# Patient Record
Sex: Male | Born: 1950 | Race: White | Hispanic: No | Marital: Married | Smoking: Current every day smoker
Health system: Southern US, Community
[De-identification: ages and names within clinical notes are randomized; demographics above are authoritative.]

## PROBLEM LIST (undated history)

## (undated) DIAGNOSIS — E079 Disorder of thyroid, unspecified: Secondary | ICD-10-CM

## (undated) DIAGNOSIS — M1990 Unspecified osteoarthritis, unspecified site: Secondary | ICD-10-CM

## (undated) HISTORY — DX: Unspecified osteoarthritis, unspecified site: M19.90

## (undated) HISTORY — DX: Disorder of thyroid, unspecified: E07.9

---

## 1971-06-17 HISTORY — PX: SHOULDER SURGERY: SHX246

## 1996-06-16 HISTORY — PX: BACK SURGERY: SHX140

## 2004-06-16 HISTORY — PX: NECK SURGERY: SHX720

## 2009-03-22 DIAGNOSIS — Z720 Tobacco use: Secondary | ICD-10-CM | POA: Insufficient documentation

## 2009-03-26 ENCOUNTER — Ambulatory Visit: Payer: Self-pay | Admitting: Gastroenterology

## 2010-07-09 ENCOUNTER — Ambulatory Visit: Payer: Self-pay

## 2011-03-24 ENCOUNTER — Ambulatory Visit: Payer: Self-pay | Admitting: Family Medicine

## 2011-05-28 ENCOUNTER — Ambulatory Visit: Payer: Self-pay | Admitting: Orthopedic Surgery

## 2011-06-23 LAB — HM COLONOSCOPY: HM Colonoscopy: NORMAL

## 2012-03-24 ENCOUNTER — Ambulatory Visit: Payer: Self-pay

## 2012-05-04 ENCOUNTER — Ambulatory Visit: Payer: Self-pay | Admitting: Specialist

## 2012-06-29 ENCOUNTER — Ambulatory Visit: Payer: Self-pay | Admitting: Specialist

## 2012-08-27 ENCOUNTER — Ambulatory Visit: Payer: Self-pay | Admitting: Family Medicine

## 2014-01-16 IMAGING — CR DG [PERSON_NAME] SPINE FLEX
1 series · 2 of 2 positions shown · non-contrast
Comparison: none

REASON FOR EXAM: instability
COMMENTS:

PROCEDURE:     KDR - KDXR L-SPINE FLEX-EXTENSION ONLY  - May 04, 2012  [DATE]
RESULT:     Comparison: 03/24/2012

[Series 1: flexion · 0.17mm/px · 2 of 2 slices shown]
[im 1/2]
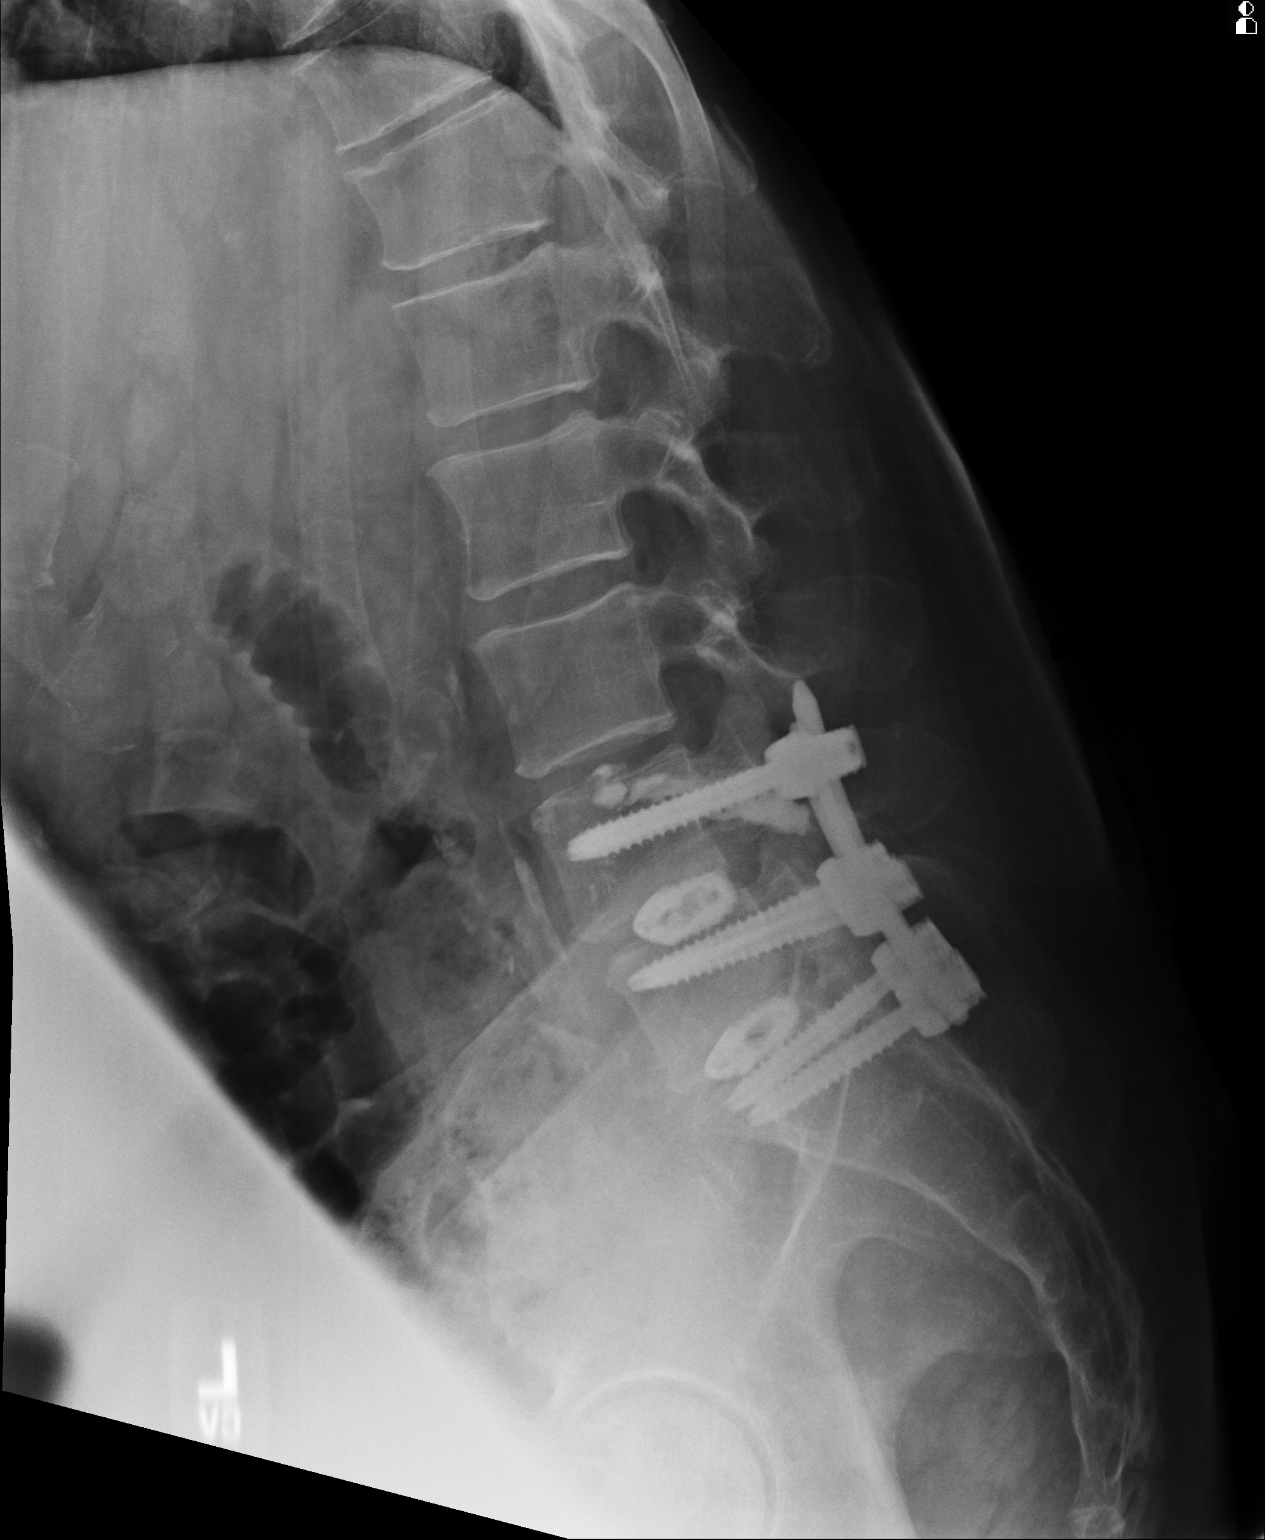
[im 2/2]
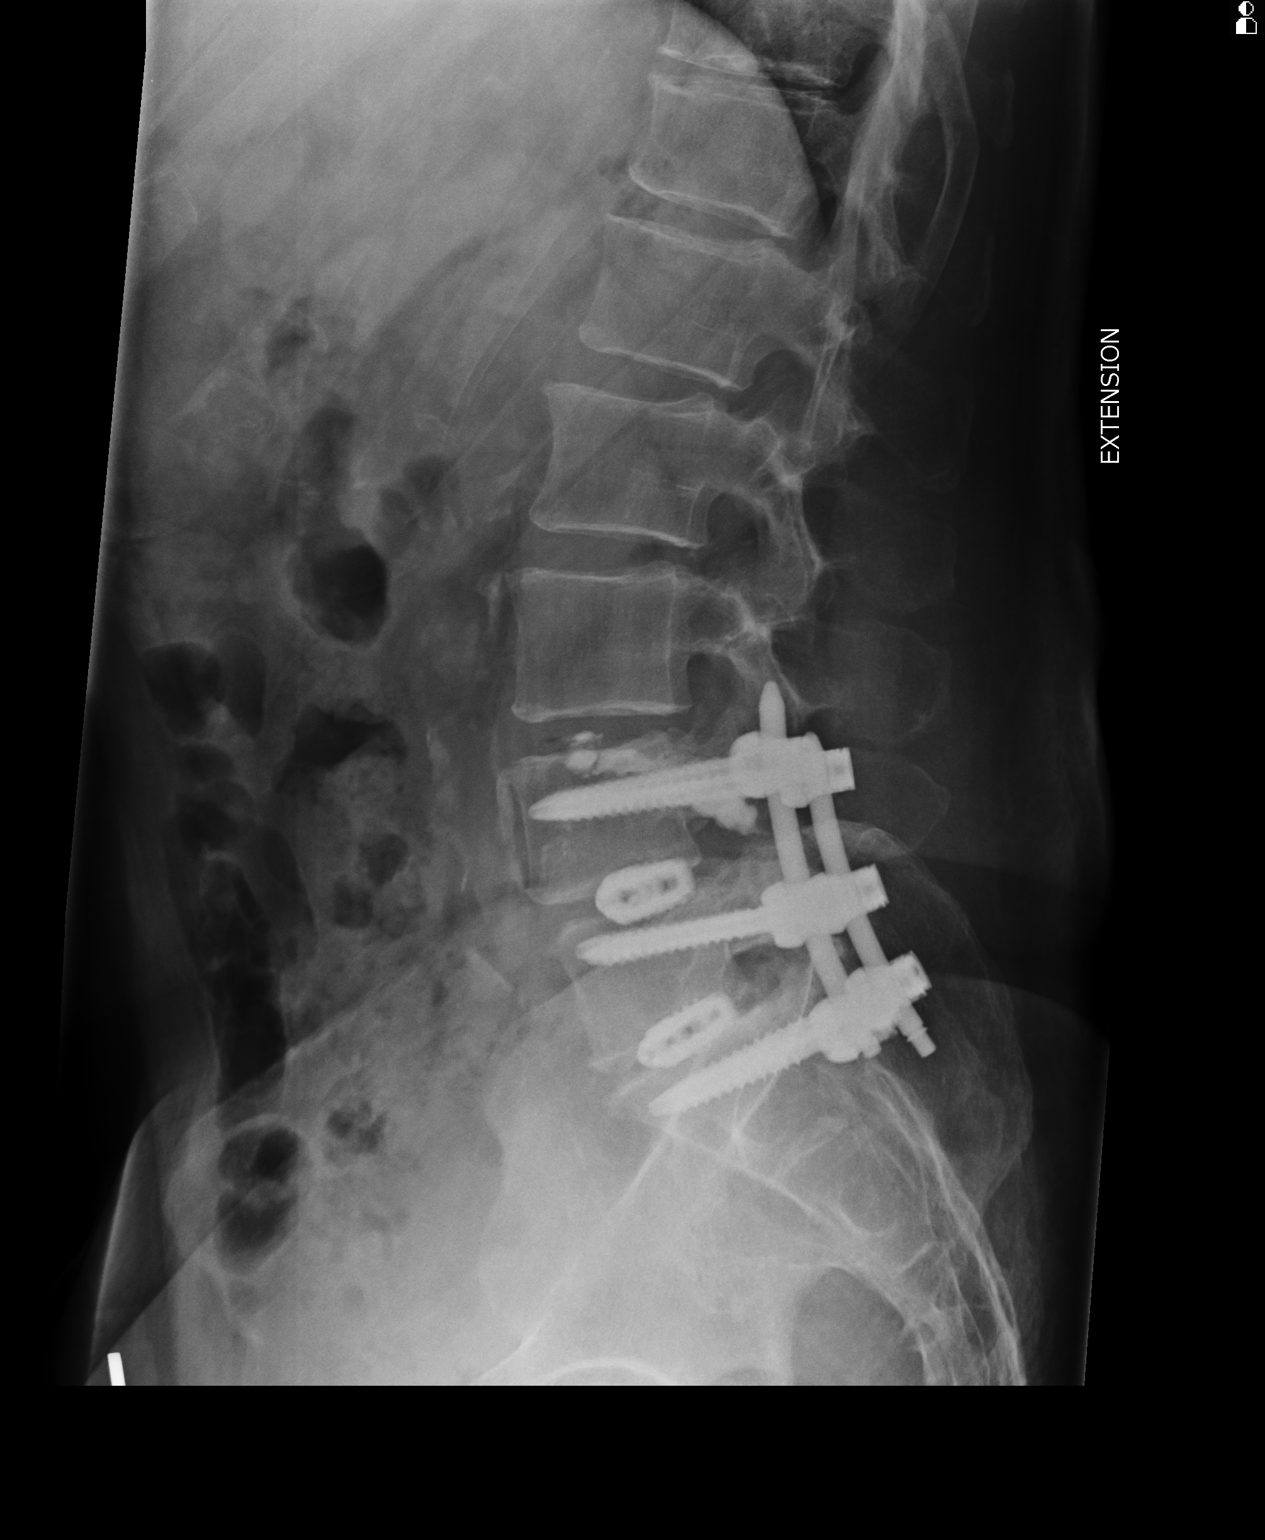

[2 of 2 positions shown; findings below may reference images not displayed]

FINDINGS: Posterior spinal fusion hardware is seen in the lower lumbar spine.
Alignment is maintained with flexion and extension, given differences in
obliquity.
IMPRESSION: Alignment is maintained with flexion and extension, given differences in
obliquity.

[REDACTED]

## 2014-08-08 ENCOUNTER — Ambulatory Visit: Payer: Self-pay | Admitting: Family Medicine

## 2014-08-10 ENCOUNTER — Inpatient Hospital Stay: Payer: Self-pay | Admitting: Internal Medicine

## 2014-08-21 ENCOUNTER — Ambulatory Visit: Payer: Self-pay | Admitting: Family Medicine

## 2014-09-23 LAB — BASIC METABOLIC PANEL
BUN: 15 mg/dL (ref 4–21)
Creatinine: 1 mg/dL (ref 0.6–1.3)
Glucose: 92 mg/dL
POTASSIUM: 4.9 mmol/L (ref 3.4–5.3)
SODIUM: 137 mmol/L (ref 137–147)

## 2014-09-23 LAB — CBC AND DIFFERENTIAL
HEMATOCRIT: 41 % (ref 41–53)
HEMOGLOBIN: 13.8 g/dL (ref 13.5–17.5)
PLATELETS: 367 10*3/uL (ref 150–399)
WBC: 8.3 10^3/mL

## 2014-09-23 LAB — HEPATIC FUNCTION PANEL
ALT: 34 U/L (ref 10–40)
AST: 22 U/L (ref 14–40)

## 2014-10-15 NOTE — H&P (Signed)
PATIENT NAME:  Billy Burton, Billy Burton MR#:  [id number] DATE OF BIRTH:  February 04, 1951  DATE OF ADMISSION:  08/10/2014  PRIMARY CARE PHYSICIAN:  Dr. Lorie PhenixNancy Maloney  REFERRING  PHYSICIAN:  Dr. Glennie IsleSheryl Gottlieb  CHIEF COMPLAINT: Shortness of breath and the cough for the past seven days.   HISTORY OF PRESENT ILLNESS: A 64 year old Caucasian male with no past medical history presented the ED with the above chief complaint. The patient is alert, awake, oriented, in no acute distress. The patient said he started to have a cough and shortness of breath seven days ago. In addition, the patient has a fever and chills which is on and off for the past seven days. The patient also complains of generalized weakness. The patient even could not stand. The patient went to see his primary care physician who gave the patient antibiotics, cefdinir.  The patient has been taking antibiotics for the past two days without improvement. The patient's symptoms have been worsening.  He denies any wheezing or hemoptysis. No orthopnea or nocturnal dyspnea. No leg edema. The patient's chest x-ray showed multifocal pneumonia and since the patient has elevated D-dimer at about 800, the patient got CT angiogram of the chest which did not show PE, but a bilateral multifocal pneumonia. The patient is treated with antibiotics in the ED.   SOCIAL HISTORY: Smokes 1 pack a day for many years. Quit smoking years ago, but started smoking six years ago. He denies any alcohol drinking or illicit drugs.   PAST SURGICAL HISTORY: Back and neck surgery.   FAMILY HISTORY: Mother had a stroke and brother has a brain tumor.   ALLERGIES: None.  HOME MEDICATIONS:  1.  Prednisone 20 mg 3 tablets once a day for 2 days and then taper.  2.  Diclofenac 75 mg p.o. once a day p.r.n.  3.  Cefdinir 300 mg p.o. every 12 hours.  4.  Acetaminophen hydrocodone 325/5 mg p.o. tablets every six hours p.r.n.   REVIEW OF SYSTEMS:    CONSTITUTIONAL: The patient denies any  the patient has a fever, chills, headache, and dizziness and generalized weakness.  EYES: No double vision or blurred vision..  ENT: No postnasal drip, slurred speech or dysphagia.  CARDIOVASCULAR:  Chest pain with coughing. No palpitations. No orthopnea or nocturnal dyspnea. No leg edema.  PULMONARY: Positive for cough, shortness of breath, but no sputum and no wheezing. No hemoptysis.  GASTROINTESTINAL: No abdominal pain, nausea, vomiting, diarrhea. No melena or bloody stool.  GENITOURINARY: No dysuria, hematuria, or incontinence.  SKIN: No rash or jaundice.  NEUROLOGY: No syncope, loss of consciousness, or seizure.  ENDOCRINE: No polyuria, polydipsia, heat or cold intolerance.  HEMATOLOGY: No easy bruising or bleeding.   PHYSICAL EXAMINATION:  VITAL SIGNS: Temperature 98.8, blood pressure 136/80, pulse 75, oxygen saturation 98% on oxygen. GENERAL:  The patient is alert, awake, oriented, in no acute distress.  HEENT: Pupils round, equal, and reactive to light and accommodation. Moist oral mucosa.  clear oropharynx.  NECK: Supple. No JVD or carotid bruit. No lymphadenopathy. No thyromegaly.  CARDIOVASCULAR: S1 and S2. Regular rate and rhythm. No murmurs or gallops.  PULMONARY: Bilateral air entry, very weak breath sounds bilaterally with some mild crackles in the whole bilateral lungs.  No use of accessory muscles to breathe.  ABDOMEN: Soft. No distention or tenderness. No organomegaly. Bowel sounds present.Marland Kitchen.  EXTREMITIES: No edema, clubbing or cyanosis. No calf tenderness but has pedal pulses present in the neck.  SKIN: No rash or jaundice.  NEUROLOGIC: Alert and oriented x3. No focal deficit. Power 5/5. Sensory intact.   LABORATORY DATA: Chest x-ray showed multifocal bilateral patchy infiltrate, pneumonia.  CT angiogram of chest did not show any PE, but had multifocal patchy airspace disease, likely representing multifocal pneumonia or inflammatory process. WBC 6.0, hemoglobin 13.6,  platelets 169, troponin 0.04, glucose 148, BUN 16, creatinine 1.09, sodium 135, potassium 4.0, chloride 100, bicarbonate 29, BNP 229,000 D-dimer 876.   IMPRESSION:  1.  Bilateral multifocal pneumonia.  2.  Tobacco abuse.  3.  Mild hyponatremia.   PLAN OF TREATMENT:  1. The patient will be admitted to medical floor and we will start Zithromax and Rocephin and follow up her blood culture, sputum culture and CBC and also legionella antigen.  2. For tobacco abuse, smoking cessation was counseled for 4 to 5 minutes. We will give a nicotine patch.  3. For hyponatremia I will give normal saline IV, follow up BMP.  4. I discussed the patient's condition and plan of treatment with the patient and the patient's wife, the patient wants DNR.   TIME SPENT: About 53 minutes    ____________________________ Shaune Pollack, MD qc:at D: 08/10/2014 20:06:00 ET T: 08/10/2014 20:52:28 ET JOB#: [id number]  cc: Shaune Pollack, MD, <Dictator> Shaune Pollack MD ELECTRONICALLY SIGNED 08/12/2014 21:35

## 2014-10-15 NOTE — Discharge Summary (Signed)
PATIENT NAME:  Billy Burton, Billy Burton MR#:  [id number] DATE OF BIRTH:  [birth year]  DATE OF ADMISSION:  08/10/2014 DATE OF DISCHARGE:  08/12/2014  DISCHARGE DIAGNOSES: Bilateral pneumonia, abnormal TSH, tobacco abuse.   CONSULTATIONS: None.   PROCEDURES: None.   BRIEF HISTORY AND PHYSICAL AND HOSPITAL COURSE BASED ON THE PROBLEM: The patient is a 64 year old Caucasian male with no past medical history came into the ED with chief complaint of shortness of breath and cough. Please review history and physical for details. CT angiogram of the chest did not show any pulmonary embolism, but has some bilateral multifocal pneumonia. The patient was started on IV antibiotics and hospitalist team is called to admit the patient.  1.  Multifocal pneumonia. Blood cultures and sputum cultures were ordered. The patient was given Rocephin and Zithromax. Also, urine for legionella antigen was ordered. With antibiotics, Rocephin and Zithromax, the patient's clinical situation significantly improved. The patient started feeling better. His antibiotics were switched to p.o. The patient was counseled to quit smoking. The patient's oxygen was weaned off to room air. He does not need any home oxygen.  2.  Abnormal TSH. Further testing needs to be done by primary care physician in 8-12 weeks by repeating thyroid function test. 3.  Mild hyponatremia. Sodium was resolved with IV fluids.   CONDITION AT THE TIME OF DISCHARGE: Stable.   PHYSICAL EXAMINATION:  VITAL SIGNS: Temperature 97.6, pulse 61, respirations 18, blood pressure 145/76, pulse oximetry 94% on room air.  GENERAL APPEARANCE: Not in acute distress. Moderately built and nourished.  HEENT: Normocephalic, atraumatic. Pupils are equally reacting to light and accommodation. No scleral icterus. No conjunctival injection. No sinus tenderness. No postnasal drip. Moist mucous membranes.  NECK: Supple. No JVD. No thyromegaly. Range of motion is intact.  LUNGS: Positive  crackles. Moderate air entry. No wheezing. No rhonchi.  CARDIAC: S1, S2 normal. Regular rate and rhythm. No murmurs.  GASTROINTESTINAL: Soft. Bowel sounds are positive in all 4 quadrants. Nontender, nondistended. No masses felt.  NEUROLOGIC: Awake, alert, oriented x 3. Cranial nerves II-XII are grossly intact. Motor and sensory are intact. Reflexes are 2+.  EXTREMITIES: No cyanosis. No clubbing.  SKIN: Warm to touch. Normal turgor. No rashes. No lesions.   LABORATORY DATA AND IMAGING STUDIES: TSH 0.132. WBC 4.9, hemoglobin 11.9, hematocrit 35.6, platelets are normal. BMP: Glucose 146, calcium 7.9, the rest of the BMP is normal. Hemoglobin A1c at 5.8. HDL 36, total cholesterol 914, LDL is 69. EKG: Normal sinus rhythm at 72. Chest x-ray, PA and lateral views, performed on February 25, multifocal bilateral patchy infiltrate pneumonia without change in aeration. CT angiogram of the chest, the study was also done on February 25, no evidence of significant pulmonary embolus. Multifocal patchy airspace disease distributed throughout both lungs, likely representing multifocal pneumonia or inflammatory process.   CODE STATUS: No code. Do not resuscitate.    DISCHARGE MEDICATIONS: Percocet 1 tablet p.o. every 6 hours as needed for pain; diclofenac 75 mg p.o. once daily as needed for pain; prednisone tapering dose starting with 50 mg once a day, then 40 mg once a day, 30 mg for 1 day, then 20 mg for 1 day, and then 1 tablet of 10 mg for 1 day and then discontinue; nicotine 14 mg patch over-the-counter transdermally to place once daily; Proventil 2 puffs inhalation every 4 hours as needed for shortness of breath; Augmentin 500/125 one tablet p.o. every 8 hours; Florastor 50 mg 1 capsule p.o. 2 times a day for  5 days.   DIET: Regular with regular consistency.   FOLLOWUP: With primary care physician in 1 week. Sputum cultures are pending at the time of discharge; these need to be followed up by PCP. PCP to consider  repeat thyroid test in 8-12 weeks, as his TSH was abnormal, which could be an acute phase reactant.   Plan of care was discussed with the patient. He is in agreement with the plan.   TOTAL TIME SPENT ON THE DISCHARGE: 45 minutes.    ____________________________ Ramonita LabAruna Francina Beery, MD ag:bm D: 08/17/2014 15:18:03 ET T: 08/17/2014 23:28:05 ET JOB#: [id number]  cc: Ramonita LabAruna Hagen Tidd, MD, <Dictator> Primary Care Physician Ramonita LabARUNA Will Schier MD ELECTRONICALLY SIGNED 08/29/2014 20:41

## 2015-03-20 ENCOUNTER — Ambulatory Visit (INDEPENDENT_AMBULATORY_CARE_PROVIDER_SITE_OTHER): Payer: No Typology Code available for payment source | Admitting: Family Medicine

## 2015-03-20 ENCOUNTER — Encounter: Payer: Self-pay | Admitting: Family Medicine

## 2015-03-20 ENCOUNTER — Telehealth: Payer: Self-pay | Admitting: Family Medicine

## 2015-03-20 VITALS — BP 124/82 | HR 80 | Temp 98.3°F | Resp 16 | Wt 190.0 lb

## 2015-03-20 DIAGNOSIS — R634 Abnormal weight loss: Secondary | ICD-10-CM | POA: Insufficient documentation

## 2015-03-20 DIAGNOSIS — R0789 Other chest pain: Secondary | ICD-10-CM | POA: Insufficient documentation

## 2015-03-20 DIAGNOSIS — R519 Headache, unspecified: Secondary | ICD-10-CM | POA: Insufficient documentation

## 2015-03-20 DIAGNOSIS — M545 Low back pain, unspecified: Secondary | ICD-10-CM | POA: Insufficient documentation

## 2015-03-20 DIAGNOSIS — M1990 Unspecified osteoarthritis, unspecified site: Secondary | ICD-10-CM | POA: Insufficient documentation

## 2015-03-20 DIAGNOSIS — M5440 Lumbago with sciatica, unspecified side: Secondary | ICD-10-CM | POA: Diagnosis not present

## 2015-03-20 DIAGNOSIS — H109 Unspecified conjunctivitis: Secondary | ICD-10-CM | POA: Diagnosis not present

## 2015-03-20 DIAGNOSIS — R51 Headache: Secondary | ICD-10-CM

## 2015-03-20 DIAGNOSIS — E0590 Thyrotoxicosis, unspecified without thyrotoxic crisis or storm: Secondary | ICD-10-CM | POA: Insufficient documentation

## 2015-03-20 DIAGNOSIS — R1012 Left upper quadrant pain: Secondary | ICD-10-CM | POA: Insufficient documentation

## 2015-03-20 MED ORDER — SULFACETAMIDE SODIUM 10 % OP SOLN: 2.0000 [drp] | OPHTHALMIC | Status: DC

## 2015-03-20 NOTE — Telephone Encounter (Signed)
Pt's wife called saying the pharmacy said they needed authorization for the eye drops you prescribed this am during his visit.  They uses [facility].  Call back for patient is [phone]

## 2015-03-20 NOTE — Progress Notes (Signed)
Subjective:    Patient ID: Billy Corners., male    DOB: 08/09/1950, 64 y.o.   MRN: [id number]  Eye Problem  The left eye is affected. This is a new problem. The current episode started in the past 7 days. The problem has been unchanged. There was no injury mechanism. The pain is at a severity of 0/10. The patient is experiencing no pain. There is no known exposure to pink eye. He does not wear contacts. Associated symptoms include blurred vision, an eye discharge, eye redness, itching and weakness. Pertinent negatives include no double vision, fever, foreign body sensation, nausea, photophobia, recent URI, tingling or vomiting. He has tried eye drops for the symptoms. The treatment provided no relief.  Back Pain This is a chronic problem. The current episode started more than 1 year ago (x 15 years). The problem occurs constantly. The problem has been gradually worsening since onset. The pain is present in the lumbar spine. The quality of the pain is described as aching. The pain radiates to the left knee and right knee. The pain is at a severity of 6/10. The pain is moderate. The pain is worse during the day. The symptoms are aggravated by sitting (walking). Associated symptoms include headaches, leg pain and weakness. Pertinent negatives include no abdominal pain, bladder incontinence, bowel incontinence, chest pain, dysuria, fever, numbness, pelvic pain or tingling. He has tried analgesics for the symptoms. The treatment provided significant relief.      Review of Systems  Constitutional: Positive for appetite change. Negative for fever, chills, diaphoresis, activity change, fatigue and unexpected weight change.       Malaise is present  Eyes: Positive for blurred vision, discharge and redness. Negative for double vision and photophobia.  Cardiovascular: Negative for chest pain.  Gastrointestinal: Negative for nausea, vomiting, abdominal pain and bowel incontinence.  Genitourinary:  Negative for bladder incontinence, dysuria and pelvic pain.  Musculoskeletal: Positive for back pain.  Skin: Positive for itching.  Neurological: Positive for weakness and headaches. Negative for tingling and numbness.   BP 124/82 mmHg  Pulse 80  Temp(Src) 98.3 F (36.8 C) (Oral)  Resp 16  Wt 190 lb (86.183 kg)   Patient Active Problem List   Diagnosis Date Noted  . Abdominal pain, left upper quadrant 03/20/2015  . Arthritis 03/20/2015  . Chest wall pain 03/20/2015  . Cephalalgia 03/20/2015  . Hyperthyroidism 03/20/2015  . LBP (low back pain) 03/20/2015  . Abnormal loss of weight 03/20/2015  . Hypercholesterolemia without hypertriglyceridemia 04/30/2009  . Alcohol drinker (HCC) 03/22/2009  . Acid reflux 03/22/2009  . Current tobacco use 03/22/2009   Past Medical History  Diagnosis Date  . Arthritis   . Thyroid disease    No current outpatient prescriptions on file prior to visit.   No current facility-administered medications on file prior to visit.   No Known Allergies Past Surgical History  Procedure Laterality Date  . Neck surgery  2006    fusion  . Shoulder surgery  1973    reconstruction  . Back surgery  1998    ruptured disc   Social History   Social History  . Marital Status: Married    Spouse Name: N/A  . Number of Children: N/A  . Years of Education: N/A   Occupational History  . Not on file.   Social History Main Topics  . Smoking status: Current Every Day Smoker -- 1.00 packs/day  . Smokeless tobacco: Never Used  . Alcohol Use: No  .  Drug Use: No  . Sexual Activity: Not on file   Other Topics Concern  . Not on file   Social History Narrative   Family History  Problem Relation Age of Onset  . Heart disease Mother   . Hypotension Mother   . Cancer Father   . Healthy Sister   . Healthy Brother   . Healthy Son   . Healthy Sister   . Healthy Sister   . Healthy Brother   . Healthy Son      .result     Objective:   Physical  Exam  Constitutional: He appears well-developed and well-nourished.  Eyes: Pupils are equal, round, and reactive to light. Left eye exhibits discharge. Left eye exhibits no hordeolum. No foreign body present in the left eye. Right conjunctiva is not injected. Right conjunctiva has no hemorrhage. Left conjunctiva is injected. Left conjunctiva has no hemorrhage.  Musculoskeletal:       Right hip: He exhibits decreased range of motion and decreased strength.       Left hip: He exhibits decreased range of motion and decreased strength.       Lumbar back: He exhibits decreased range of motion and pain.  Psychiatric: He has a normal mood and affect. His behavior is normal.      Assessment & Plan:   1. Conjunctivitis of left eye New problem. Advised pt to D/C Visine and start abx eye drops as below.  Patient instructed to call back if condition worsens or does not improve.    - sulfacetamide (BLEPH-10) 10 % ophthalmic solution; Place 2 drops into the left eye every 4 (four) hours.  Dispense: 15 mL; Refill: 0  2. Low back pain with sciatica, sciatica laterality unspecified, unspecified back pain laterality Stable. DMV form filled out today to handicap placard today.  Patient seen and examined by Leo Grosser, MD, and note scribed by Allene Dillon, CMA.  I have reviewed the document for accuracy and completeness and I agree with above. Leo Grosser, MD   Lorie Phenix, MD

## 2015-03-20 NOTE — Telephone Encounter (Signed)
Got it changed. Pharmacy will call the patient. Thanks.

## 2015-03-26 ENCOUNTER — Ambulatory Visit (INDEPENDENT_AMBULATORY_CARE_PROVIDER_SITE_OTHER): Payer: No Typology Code available for payment source | Admitting: Family Medicine

## 2015-03-26 ENCOUNTER — Encounter: Payer: Self-pay | Admitting: Family Medicine

## 2015-03-26 VITALS — BP 108/70 | HR 74 | Temp 97.8°F | Resp 16 | Wt 196.2 lb

## 2015-03-26 DIAGNOSIS — T50905A Adverse effect of unspecified drugs, medicaments and biological substances, initial encounter: Secondary | ICD-10-CM

## 2015-03-26 NOTE — Patient Instructions (Signed)
Stop antibiotic drops, use artificial tears and warm compresses. Call us with how you are doing in 24 hours.

## 2015-03-26 NOTE — Progress Notes (Signed)
Subjective:     Patient ID: Billy Burton., male   DOB: 06-28-1950, 64 y.o.   MRN: [id number]  HPI  Chief Complaint  Patient presents with  . Conjunctivitis    patient is present in office today for follow up visit from 03/20/15. Last office visit patient was diagnosed with conjunctivitis of the left eye and prescribed Bleph-10% eye drops he states that drops have made symptoms worse. Paitent reports burning and watering of eyes after using drops, patient states that he still has redness , itching and discharge from eye.   States he did not start the drops along with warm compresses until 10/7.  Reports using 2 drops every 2 hours. States the drops burn and his eye itching worsened. Started artificial tears which have helped the discomfort. Reports continued eye watering, mild redness and blurriness due to the watering. No prior reaction to Sulfa reported.   Review of Systems  Constitutional: Negative for fever and chills.       Objective:   Physical Exam  Constitutional: He appears well-developed and well-nourished. No distress.  Eyes: Pupils are equal, round, and reactive to light.  mild swelling and injection of left eye. Vision intact to # of fingers. Mild watery drainage. No f.b. Visualized.     Assessment:    1. Idiosyncratic reaction to medication after proper dose, initial encounter     Plan:    Stop antibiotic drops, continue artificial tears and warm compresses. Phone f/u in 24 hours.

## 2015-05-09 ENCOUNTER — Encounter: Payer: Self-pay | Admitting: Family Medicine

## 2015-05-09 ENCOUNTER — Telehealth: Payer: Self-pay

## 2015-05-09 ENCOUNTER — Ambulatory Visit (INDEPENDENT_AMBULATORY_CARE_PROVIDER_SITE_OTHER): Payer: Self-pay | Admitting: Family Medicine

## 2015-05-09 VITALS — BP 100/70 | HR 77 | Temp 97.8°F | Resp 16 | Wt 191.8 lb

## 2015-05-09 DIAGNOSIS — G510 Bell's palsy: Secondary | ICD-10-CM | POA: Insufficient documentation

## 2015-05-09 MED ORDER — PREDNISONE 10 MG PO TABS: ORAL_TABLET | ORAL | Status: DC

## 2015-05-09 MED ORDER — VALACYCLOVIR HCL 1 G PO TABS: 1000.0000 mg | ORAL_TABLET | Freq: Three times a day (TID) | ORAL | Status: DC

## 2015-05-09 NOTE — Telephone Encounter (Signed)
Pt called c/o lip numbness and eyelid droopiness. States, "my eye isn't working right". Pt denies H/A, confusion, and was speaking clearly. Encouraged pt to to go to ED if any stroke sx appear. Appointment made for 11:00 am. Allene DillonEmily Drozdowski, CMA

## 2015-05-09 NOTE — Progress Notes (Signed)
       Patient: Billy FuDanny L Hunke Sr. Male    DOB: 08/20/1950   64 y.o.   MRN: [id number] Visit Date: 05/09/2015  Today's Provider: Lorie PhenixNancy Josealfredo Adkins, MD   Chief Complaint  Patient presents with  . Numbness    on the right side of face   Subjective:    HPI  Billy FuDanny L Kaeser is here today concern about feeling numbness in the right side of his face. Noticed yesterday morning. Patient has difficulty making facial expressions, such as closing his eye or smiling. Patient states that when drinks it comes drooling out.No Pain around the jaw or behind his ear. No rash.  Does not feel sick overall. No other neurologic deficits.     Did know someone that had the same thing. Wanted to know if it was contagious.   No Known Allergies Previous Medications   ATORVASTATIN (LIPITOR) 80 MG TABLET       CITALOPRAM (CELEXA) 40 MG TABLET       HYDROCODONE-ACETAMINOPHEN (NORCO/VICODIN) 5-325 MG TABLET        Review of Systems  Constitutional: Negative for activity change, appetite change and fatigue.  Neurological: Positive for facial asymmetry and numbness. Negative for dizziness, tremors, syncope, speech difficulty and weakness.    Social History  Substance Use Topics  . Smoking status: Current Every Day Smoker -- 1.00 packs/day  . Smokeless tobacco: Never Used  . Alcohol Use: No   Objective:   BP 100/70 mmHg  Pulse 77  Temp(Src) 97.8 F (36.6 C) (Oral)  Resp 16  Wt 191 lb 12.8 oz (87 kg)  Physical Exam  Constitutional: He is oriented to person, place, and time. He appears well-developed and well-nourished.  Cardiovascular: Normal rate and regular rhythm.   Pulmonary/Chest: Effort normal and breath sounds normal.  Neurological: He is alert and oriented to person, place, and time. He displays normal reflexes. A cranial nerve deficit is present. Coordination normal.  Facial droop on the right.  Unable to close eye lid. Also,  Can not raise eyebrow.    Psychiatric: He has a normal mood and  affect. His behavior is normal. Judgment and thought content normal.      Assessment & Plan:     1. Bell's palsy New problem. Unable to close eye.  Will take medication as below.    Also, will use eye lubricant drops,  And tape eye at night.  Patient instructed to call back if condition worsens or does not improve.    - predniSONE (DELTASONE) 10 MG tablet; 6 po for 2 days and then 5 po for 2 days and then 4 po for 2 days and 3 po for 2 days and then 2 po for 2 days and then 1 po for 2 days.  Dispense: 42 tablet; Refill: 0 - valACYclovir (VALTREX) 1000 MG tablet; Take 1 tablet (1,000 mg total) by mouth 3 (three) times daily.  Dispense: 21 tablet; Refill: 0     Lorie PhenixNancy Alveta Quintela, MD  [facility]

## 2016-04-23 IMAGING — CR DG CHEST 2V
1 series · 1 of 1 positions shown · non-contrast
Comparison: 08/08/2014

CLINICAL DATA: Smoker, pneumonia

EXAM:
CHEST  2 VIEW

[dxr chest pa (or ap) and lateral]
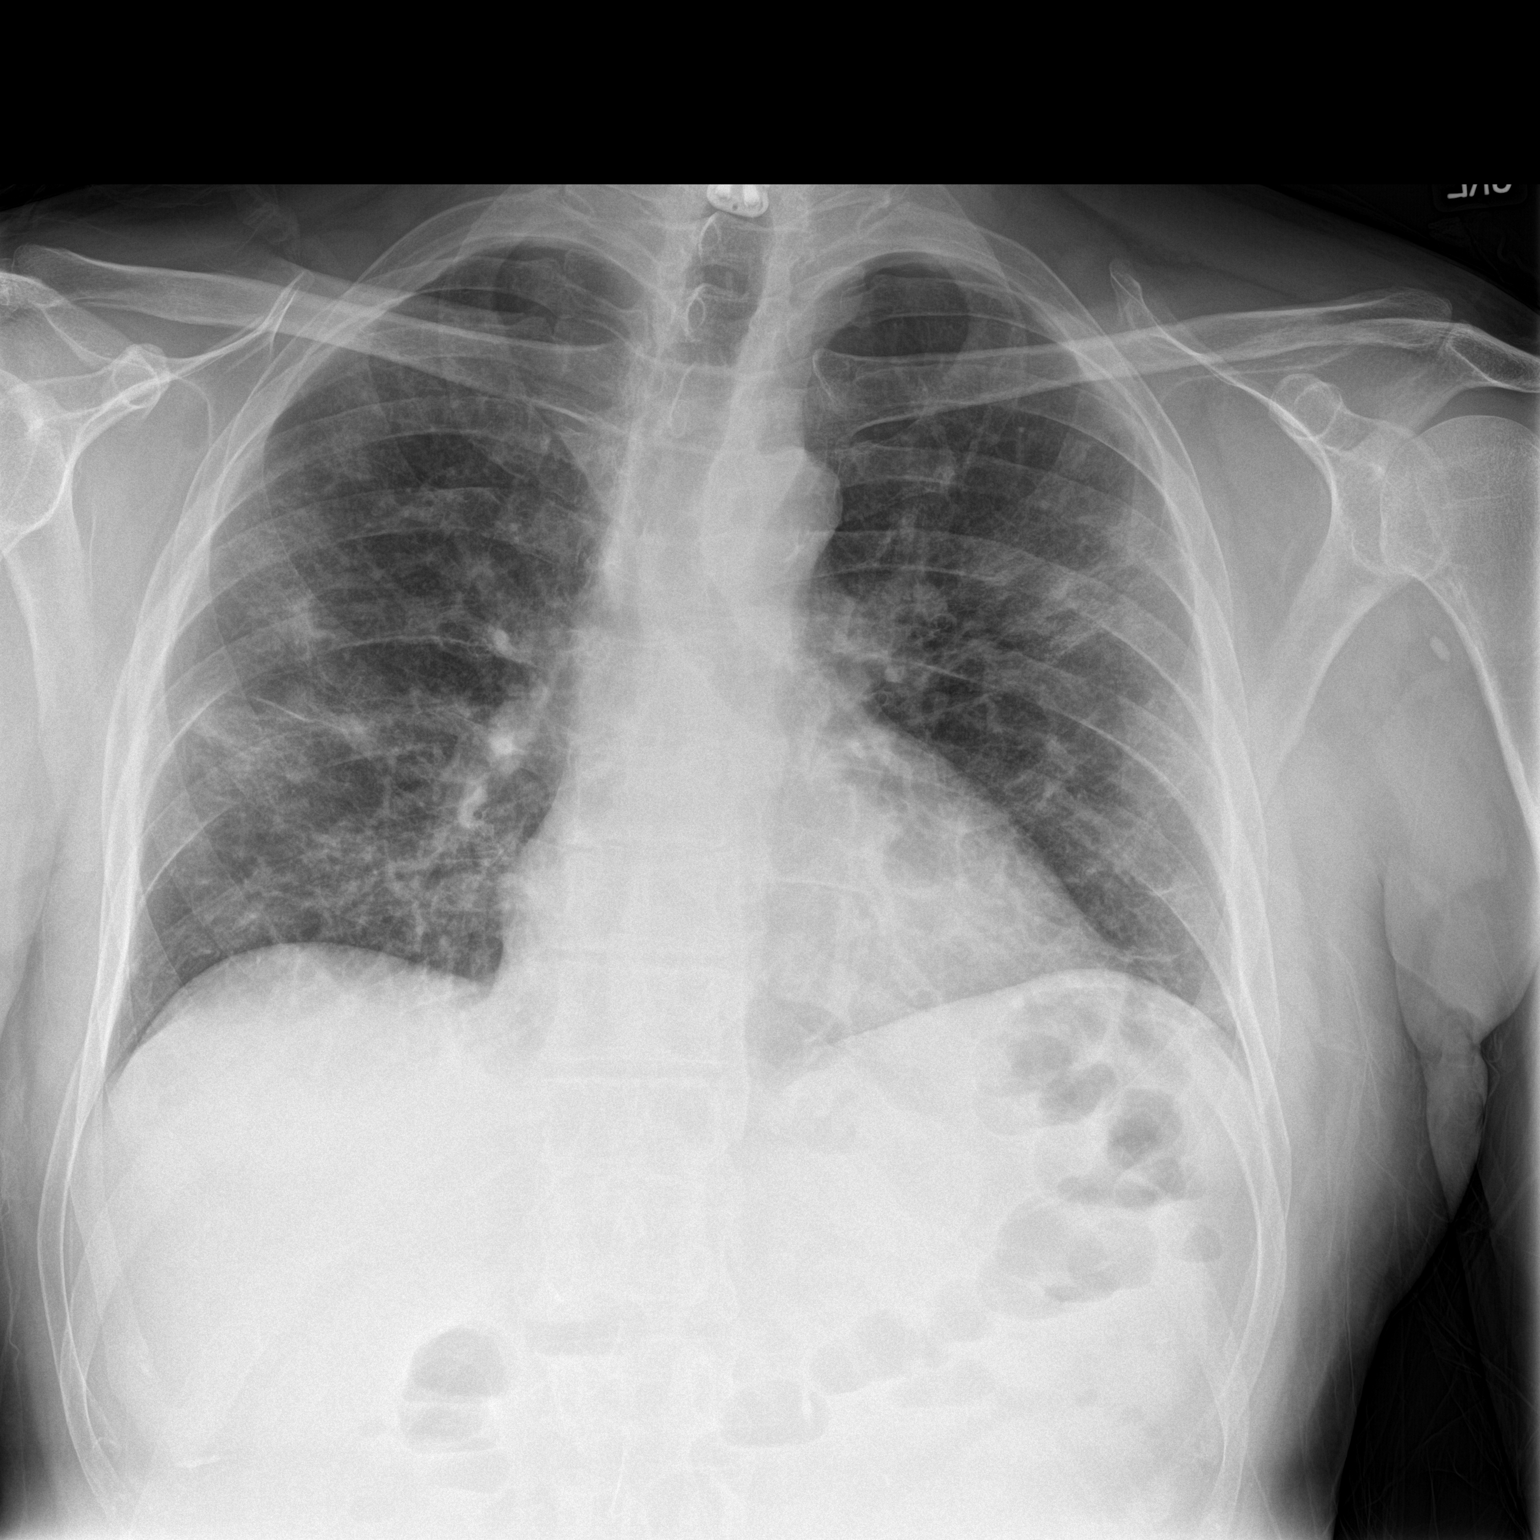

[1 of 1 positions shown; findings below may reference images not displayed]

FINDINGS: Cardiomediastinal silhouette is stable. Multifocal patchy
infiltrate/ pneumonia again noted without change from prior exam. No
pulmonary edema. Bony thorax is unremarkable.
IMPRESSION: Again noted multifocal bilateral patchy infiltrate/ pneumonia
without change in aeration.

## 2016-04-23 IMAGING — CT CT ANGIO CHEST
2 of 6 series · 18 of 36 positions shown · IV contrast (APPLIED)
Comparison: Chest 08/10/2014

CLINICAL DATA: Shortness of breath and chest pain for 1 week.
Elevated D-dimer. Sent here from primary care physician for
pneumonia.

EXAM:
CT ANGIOGRAPHY CHEST WITH CONTRAST
TECHNIQUE: Multidetector CT imaging of the chest was performed using the
standard protocol during bolus administration of intravenous
contrast. Multiplanar CT image reconstructions and MIPs were
obtained to evaluate the vascular anatomy.
CONTRAST:  100 mL Omnipaque 350

[Series 5: pe 1.0 thins · axial · 0.67mm/px · z∈[+456,+704]mm · 17 of 281 slices shown]
[im 16/281  lung]
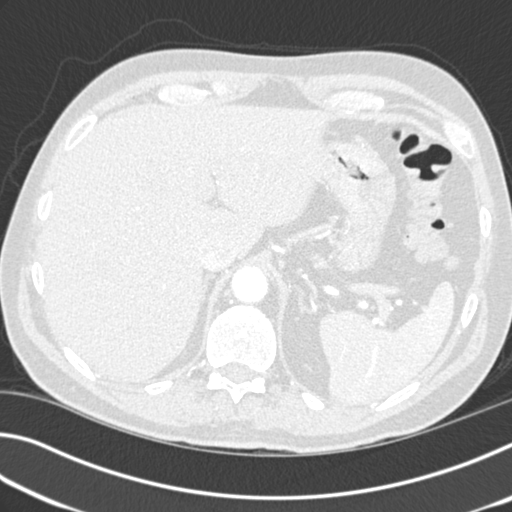
[im 32/281  mediastinal]
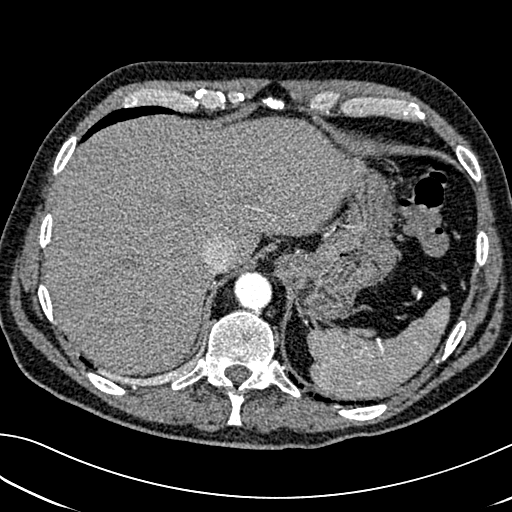
[im 47/281  lung]
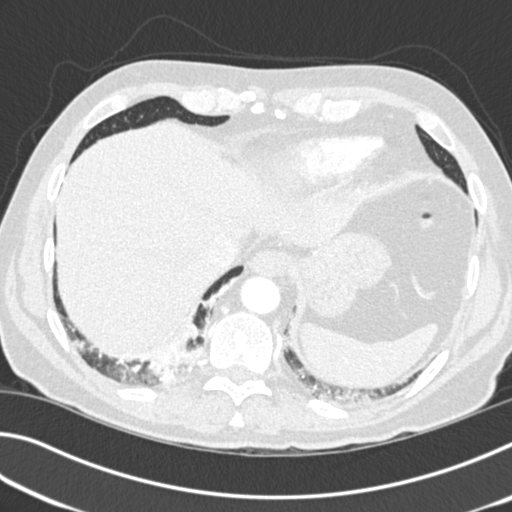
[im 63/281  mediastinal]
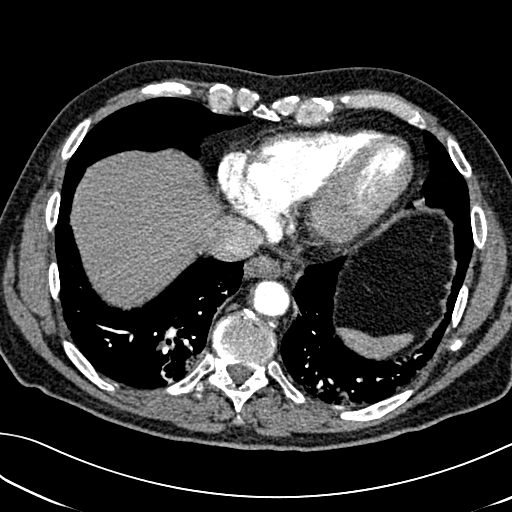
[im 78/281  lung]
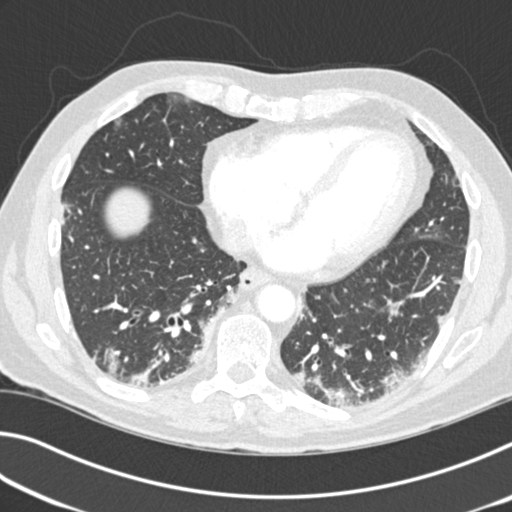
[im 94/281  mediastinal]
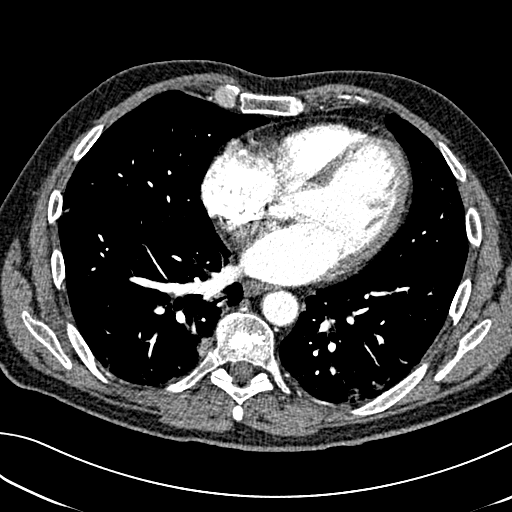
[im 109/281  lung]
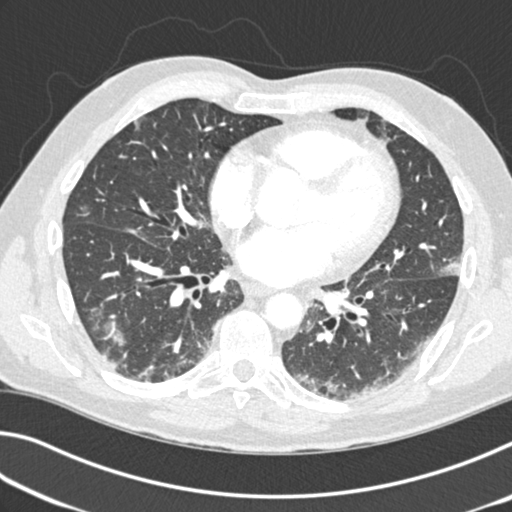
[im 125/281  mediastinal]
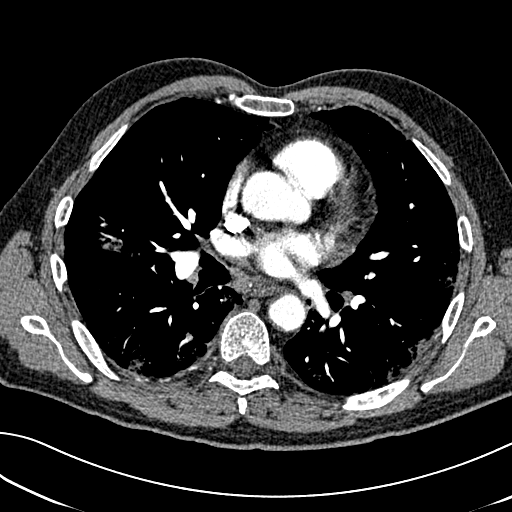
[im 141/281  lung]
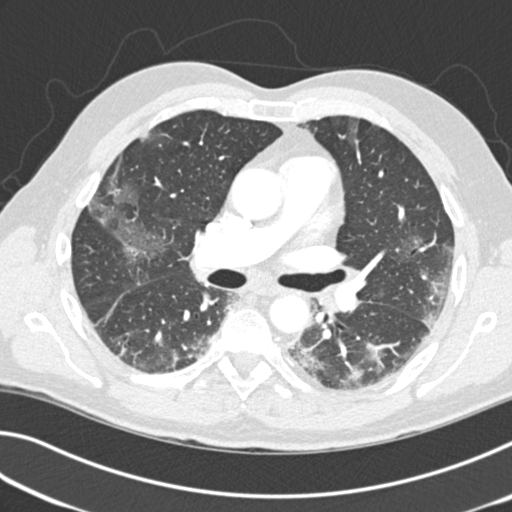
[im 156/281  mediastinal]
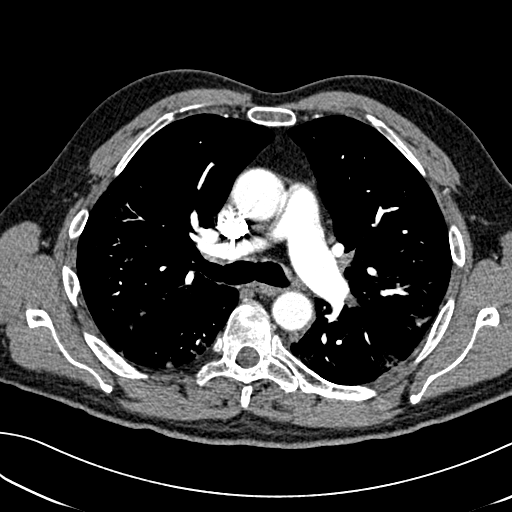
[im 172/281  lung]
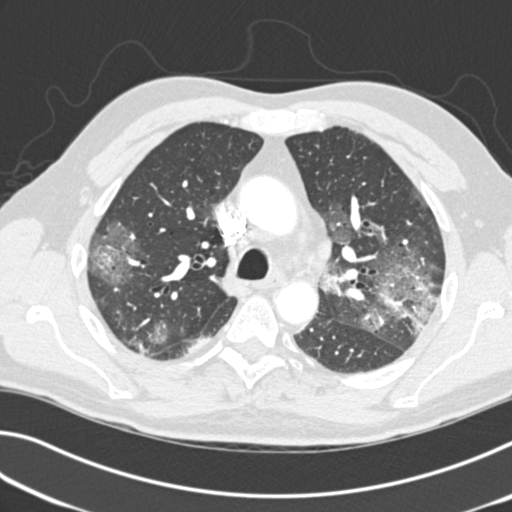
[im 187/281  mediastinal]
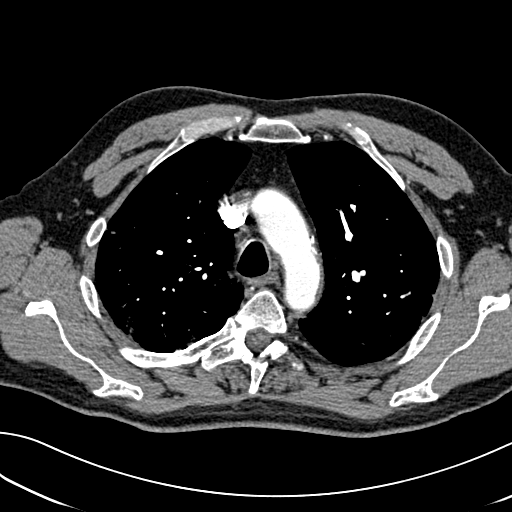
[im 203/281  lung]
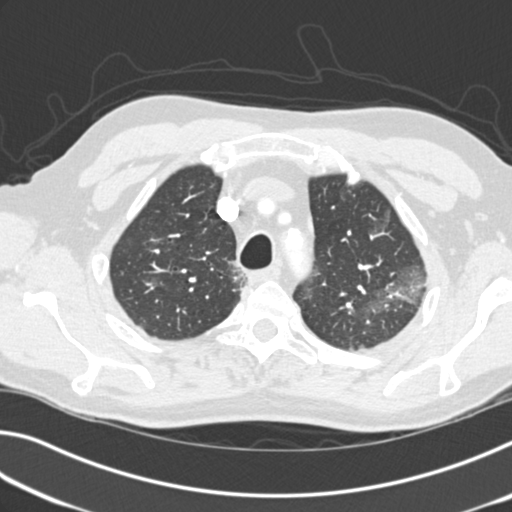
[im 218/281  mediastinal]
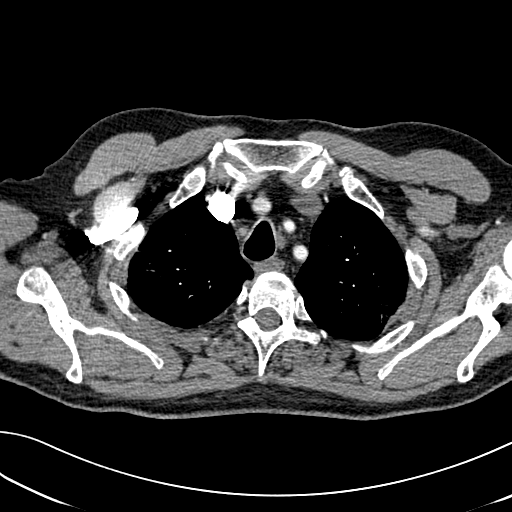
[im 234/281  lung]
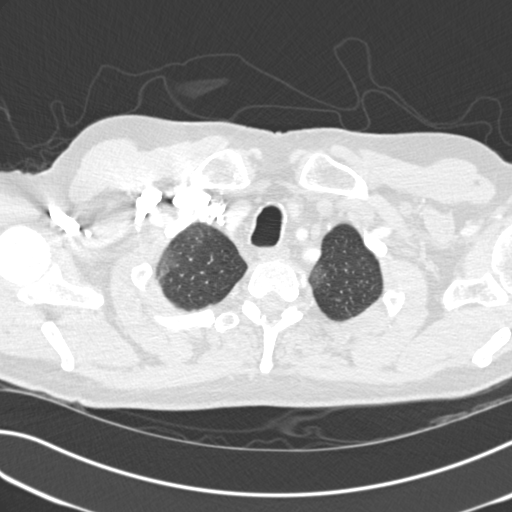
[im 249/281  mediastinal]
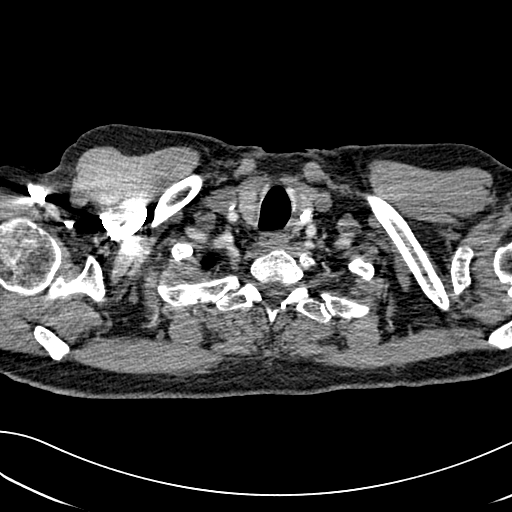
[im 265/281  lung]
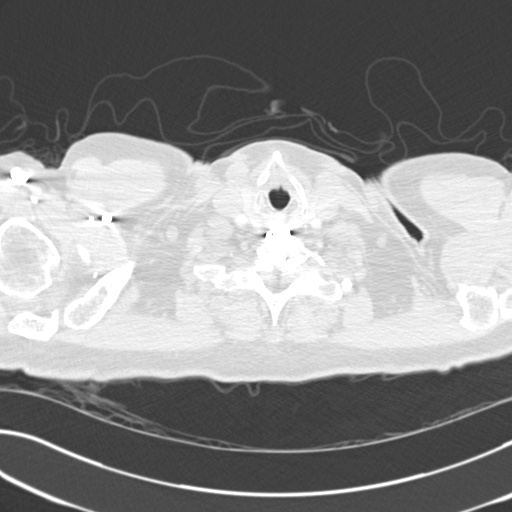

[Series 7: cor pe 2.0 mpr · coronal · 0.56mm/px · 1 of 128 slices shown]
[im 64/128  mediastinal]
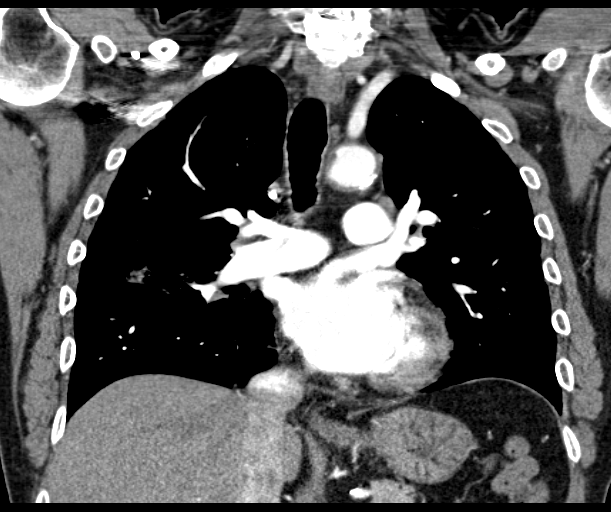

[18 of 36 positions shown; findings below may reference images not displayed]

FINDINGS: Technically adequate study with good opacification of the central
and segmental pulmonary arteries. No focal filling defects
demonstrated. No evidence of significant pulmonary embolus.

Normal heart size. Normal caliber thoracic aorta. No aortic
dissection. Great vessel origins are patent. Calcification in the
aorta and focally within the Coronary arteries. Calcification in the
left thyroid gland without enlargement. Esophagus is decompressed.
No significant lymphadenopathy in the chest.

Multifocal patchy areas of airspace disease distributed throughout
both lungs most likely represent multifocal pneumonia. Airways
appear patent. No pleural effusions. No pneumothorax.

Included portions of the upper abdominal organs are grossly
unremarkable. Mild degenerative changes in the spine. Schmorl's
nodes.

Review of the MIP images confirms the above findings.
IMPRESSION: No evidence of significant pulmonary embolus. Multifocal patchy
airspace disease distributed throughout both lungs likely
representing multifocal pneumonia or inflammatory process.

## 2020-02-03 ENCOUNTER — Telehealth: Payer: Self-pay

## 2020-02-03 NOTE — Telephone Encounter (Signed)
Yes he can establish with me.   He is previous patient of our office but not seen since 2016 with Dr. Elease Hashimoto. Will need new patient appt please.

## 2020-02-03 NOTE — Telephone Encounter (Signed)
Copied from CRM [id number]. Topic: Appointment Scheduling - Scheduling Inquiry for Clinic >> Feb 03, 2020  3:52 PM Randol Kern wrote: Reason for CRM: Pt's wife (established pt) called requesting for pt to be est. With Joycelyn Man as well. Pt's wife sees De Nurse contact: [phone] or home number [phone]

## 2020-02-06 NOTE — Telephone Encounter (Signed)
Patient scheduled.

## 2020-03-09 ENCOUNTER — Other Ambulatory Visit: Payer: Self-pay

## 2020-03-09 ENCOUNTER — Encounter: Payer: Self-pay | Admitting: Physician Assistant

## 2020-03-09 ENCOUNTER — Ambulatory Visit (INDEPENDENT_AMBULATORY_CARE_PROVIDER_SITE_OTHER): Payer: Medicare Other | Admitting: Physician Assistant

## 2020-03-09 VITALS — BP 112/82 | HR 86 | Temp 97.9°F | Ht 70.0 in | Wt 192.6 lb

## 2020-03-09 DIAGNOSIS — M25552 Pain in left hip: Secondary | ICD-10-CM

## 2020-03-09 DIAGNOSIS — G8929 Other chronic pain: Secondary | ICD-10-CM

## 2020-03-09 DIAGNOSIS — M25551 Pain in right hip: Secondary | ICD-10-CM | POA: Diagnosis not present

## 2020-03-09 DIAGNOSIS — M5442 Lumbago with sciatica, left side: Secondary | ICD-10-CM

## 2020-03-09 DIAGNOSIS — M5441 Lumbago with sciatica, right side: Secondary | ICD-10-CM | POA: Diagnosis not present

## 2020-03-09 DIAGNOSIS — Z6827 Body mass index (BMI) 27.0-27.9, adult: Secondary | ICD-10-CM

## 2020-03-09 MED ORDER — MELOXICAM 15 MG PO TABS: 15.0000 mg | ORAL_TABLET | Freq: Every day | ORAL | 0 refills | Status: AC

## 2020-03-09 MED ORDER — METHYLPREDNISOLONE 4 MG PO TBPK: ORAL_TABLET | ORAL | 0 refills | Status: AC

## 2020-03-09 NOTE — Progress Notes (Signed)
New patient visit   Patient: Billy RODDY Sr.   DOB: 28-Aug-1950   69 y.o. Male  MRN: [id number] Visit Date: 03/09/2020  Today's healthcare provider: Margaretann Loveless, PA-C   Chief Complaint  Patient presents with   New Patient (Initial Visit)   Hip Pain   Subjective    Billy Fu Sr. is a 69 y.o. male who presents today as a new patient to establish care. Patient previously was a patient of Dr. Elease Hashimoto. He is followed by the [place] [facility] for most medical care.  Patient also needs Handi-cap placard signed.   Hip Pain  The incident occurred more than 1 week ago. There was no injury mechanism. The pain is present in the left hip and right hip. The quality of the pain is described as burning. The pain is at a severity of 2/10 (walking- 10). The pain has been constant since onset. Pertinent negatives include no inability to bear weight, loss of motion, loss of sensation, muscle weakness, numbness or tingling. He reports no foreign bodies present. The symptoms are aggravated by weight bearing and movement. He has tried rest and non-weight bearing for the symptoms.    He does have h/o chronic back pain and is s/p lumbar fusion and cervical fusion. He is on chronic pain management from the [place] (hydrocodone-apap).  Past Medical History:  Diagnosis Date   Arthritis    Thyroid disease    Past Surgical History:  Procedure Laterality Date   BACK SURGERY  1998   ruptured disc   NECK SURGERY  2006   fusion   SHOULDER SURGERY  1973   reconstruction   Family Status  Relation Name Status   Mother  Deceased at age 98       stroke   Father  Deceased at age 10       MI   Sister ##Sister1 Alive   Brother ##Brother1 Alive   Son ##Son1 Alive   Sister ##Sister2 Alive   Sister ##Sister3 Alive   Brother ##Brother2 Alive   Son ##Son2 Alive   Family History  Problem Relation Age of Onset   Heart disease Mother    Hypotension Mother    Cancer Father     Healthy Sister    Healthy Brother    Healthy Son    Healthy Sister    Healthy Sister    Healthy Brother    Healthy Son    Social History   Socioeconomic History   Marital status: Married    Spouse name: Not on file   Number of children: Not on file   Years of education: Not on file   Highest education level: Not on file  Occupational History   Not on file  Tobacco Use   Smoking status: Current Every Day Smoker    Packs/day: 1.00   Smokeless tobacco: Never Used  Substance and Sexual Activity   Alcohol use: No   Drug use: No   Sexual activity: Not on file  Other Topics Concern   Not on file  Social History Narrative   Not on file   Social Determinants of Health   Financial Resource Strain:    Difficulty of Paying Living Expenses: Not on file  Food Insecurity:    Worried About Programme researcher, broadcasting/film/video in the Last Year: Not on file   [employer] of Food in the Last Year: Not on file  Transportation Needs:    Lack of Transportation (Medical): Not  on file   Lack of Transportation (Non-Medical): Not on file  Physical Activity:    Days of Exercise per Week: Not on file   Minutes of Exercise per Session: Not on file  Stress:    Feeling of Stress : Not on file  Social Connections:    Frequency of Communication with Friends and Family: Not on file   Frequency of Social Gatherings with Friends and Family: Not on file   Attends Religious Services: Not on file   Active Member of Clubs or Organizations: Not on file   Attends Banker Meetings: Not on file   Marital Status: Not on file   Outpatient Medications Prior to Visit  Medication Sig   HYDROcodone-acetaminophen (NORCO/VICODIN) 5-325 MG tablet    [DISCONTINUED] atorvastatin (LIPITOR) 80 MG tablet  (Patient not taking: Reported on 03/09/2020)   [DISCONTINUED] citalopram (CELEXA) 40 MG tablet  (Patient not taking: Reported on 03/09/2020)   [DISCONTINUED] predniSONE (DELTASONE) 10 MG  tablet 6 po for 2 days and then 5 po for 2 days and then 4 po for 2 days and 3 po for 2 days and then 2 po for 2 days and then 1 po for 2 days. (Patient not taking: Reported on 03/09/2020)   [DISCONTINUED] valACYclovir (VALTREX) 1000 MG tablet Take 1 tablet (1,000 mg total) by mouth 3 (three) times daily. (Patient not taking: Reported on 03/09/2020)   No facility-administered medications prior to visit.   No Known Allergies   There is no immunization history on file for this patient.  Health Maintenance  Topic Date Due   Hepatitis C Screening  Never done   COVID-19 Vaccine (1) Never done   TETANUS/TDAP  Never done   PNA vac Low Risk Adult (1 of 2 - PCV13) Never done   INFLUENZA VACCINE  03/09/2020 (Originally 01/15/2020)   COLONOSCOPY  06/22/2021    Patient Care Team: Reine Just as PCP - General (Family Medicine)  Review of Systems  Constitutional: Positive for fatigue.  HENT: Positive for tinnitus.   Eyes: Negative.   Respiratory: Negative.   Cardiovascular: Negative.   Gastrointestinal: Negative.   Endocrine: Negative.   Genitourinary: Negative.   Musculoskeletal: Positive for arthralgias, back pain and neck stiffness.  Skin: Negative.   Allergic/Immunologic: Negative.   Neurological: Negative.  Negative for tingling and numbness.  Hematological: Negative.   Psychiatric/Behavioral: Negative.       Objective    BP 112/82 (BP Location: Left Arm, Patient Position: Sitting, Cuff Size: Large)    Pulse 86    Temp 97.9 F (36.6 C) (Oral)    Ht 5\' 10"  (1.778 m)    Wt 192 lb 9.6 oz (87.4 kg)    BMI 27.64 kg/m  Physical Exam Vitals reviewed.  Constitutional:      General: He is not in acute distress.    Appearance: Normal appearance. He is well-developed. He is not ill-appearing or diaphoretic.  HENT:     Head: Normocephalic and atraumatic.  Eyes:     General: No scleral icterus.    Extraocular Movements: Extraocular movements intact.  Neck:      Thyroid: No thyromegaly.     Vascular: No carotid bruit or JVD.     Trachea: No tracheal deviation.  Cardiovascular:     Rate and Rhythm: Normal rate and regular rhythm.     Pulses: Normal pulses.     Heart sounds: Normal heart sounds. No murmur heard.  No friction rub. No gallop.  Pulmonary:     Effort: Pulmonary effort is normal. No respiratory distress.     Breath sounds: Normal breath sounds. No wheezing or rales.  Musculoskeletal:     Cervical back: Normal range of motion and neck supple.     Right lower leg: No edema.     Left lower leg: No edema.  Lymphadenopathy:     Cervical: No cervical adenopathy.  Skin:    Capillary Refill: Capillary refill takes less than 2 seconds.  Neurological:     General: No focal deficit present.     Mental Status: He is alert. Mental status is at baseline.      Depression Screen No flowsheet data found. No results found for any visits on 03/09/20.  Assessment & Plan      1. Bilateral hip pain DDx: OA vs neurogenic claudication. Will treat with medrol dose pak as below. Transition to meloxicam once medrol completed. Discussed getting xrays but will wait to see how he responds to the medications. May get [facility] at [facility]. Handicap placard completed and given to patient. Call if worsening.  - methylPREDNISolone (MEDROL) 4 MG TBPK tablet; 6 day taper; take as directed on package instructions  Dispense: 21 tablet; Refill: 0 - meloxicam (MOBIC) 15 MG tablet; Take 1 tablet (15 mg total) by mouth daily. Start once steroid (medrol) is completed  Dispense: 90 tablet; Refill: 0  2. Chronic low back pain with bilateral sciatica, unspecified back pain laterality See above medical treatment plan. - methylPREDNISolone (MEDROL) 4 MG TBPK tablet; 6 day taper; take as directed on package instructions  Dispense: 21 tablet; Refill: 0 - meloxicam (MOBIC) 15 MG tablet; Take 1 tablet (15 mg total) by mouth daily. Start once steroid (medrol) is completed  Dispense:  90 tablet; Refill: 0  3. BMI 27.0-27.9,adult Counseled patient on healthy lifestyle modifications including dieting and exercise.    No follow-ups on file.     Delmer Islam, PA-C, have reviewed all documentation for this visit. The documentation on 03/12/20 for the exam, diagnosis, procedures, and orders are all accurate and complete.   Reine Just  [facility] [phone] (phone) [phone] (fax)  [facility]

## 2020-03-09 NOTE — Patient Instructions (Addendum)
Hip Pain The hip is the joint between the upper legs and the lower pelvis. The bones, cartilage, tendons, and muscles of your hip joint support your body and allow you to move around. Hip pain can range from a minor ache to severe pain in one or both of your hips. The pain may be felt on the inside of the hip joint near the groin, or on the outside near the buttocks and upper thigh. You may also have swelling or stiffness in your hip area. Follow these instructions at home: Managing pain, stiffness, and swelling      If directed, put ice on the painful area. To do this: ? Put ice in a plastic bag. ? Place a towel between your skin and the bag. ? Leave the ice on for 20 minutes, 2-3 times a day.  If directed, apply heat to the affected area as often as told by your health care provider. Use the heat source that your health care provider recommends, such as a moist heat pack or a heating pad. ? Place a towel between your skin and the heat source. ? Leave the heat on for 20-30 minutes. ? Remove the heat if your skin turns bright red. This is especially important if you are unable to feel pain, heat, or cold. You may have a greater risk of getting burned. Activity  Do exercises as told by your health care provider.  Avoid activities that cause pain. General instructions   Take over-the-counter and prescription medicines only as told by your health care provider.  Keep a journal of your symptoms. Write down: ? How often you have hip pain. ? The location of your pain. ? What the pain feels like. ? What makes the pain worse.  Sleep with a pillow between your legs on your most comfortable side.  Keep all follow-up visits as told by your health care provider. This is important. Contact a health care provider if:  You cannot put weight on your leg.  Your pain or swelling continues or gets worse after one week.  It gets harder to walk.  You have a fever. Get help right away  if:  You fall.  You have a sudden increase in pain and swelling in your hip.  Your hip is red or swollen or very tender to touch. Summary  Hip pain can range from a minor ache to severe pain in one or both of your hips.  The pain may be felt on the inside of the hip joint near the groin, or on the outside near the buttocks and upper thigh.  Avoid activities that cause pain.  Write down how often you have hip pain, the location of the pain, what makes it worse, and what it feels like. This information is not intended to replace advice given to you by your health care provider. Make sure you discuss any questions you have with your health care provider. Document Revised: 10/18/2018 Document Reviewed: 10/18/2018 Elsevier Patient Education  2020 Elsevier Inc.   Hip Exercises Ask your health care provider which exercises are safe for you. Do exercises exactly as told by your health care provider and adjust them as directed. It is normal to feel mild stretching, pulling, tightness, or discomfort as you do these exercises. Stop right away if you feel sudden pain or your pain gets worse. Do not begin these exercises until told by your health care provider. Stretching and range-of-motion exercises These exercises warm up your muscles and joints   and improve the movement and flexibility of your hip. These exercises also help to relieve pain, numbness, and tingling. You may be asked to limit your range of motion if you had a hip replacement. Talk to your health care provider about these restrictions. Hamstrings, supine  1. Lie on your back (supine position). 2. Loop a belt or towel over the ball of your left / right foot. The ball of your foot is on the walking surface, right under your toes. 3. Straighten your left / right knee and slowly pull on the belt or towel to raise your leg until you feel a gentle stretch behind your knee (hamstring). ? Do not let your knee bend while you do this. ? Keep  your other leg flat on the floor. 4. Hold this position for __________ seconds. 5. Slowly return your leg to the starting position. Repeat __________ times. Complete this exercise __________ times a day. Hip rotation  1. Lie on your back on a firm surface. 2. With your left / right hand, gently pull your left / right knee toward the shoulder that is on the same side of the body. Stop when your knee is pointing toward the ceiling. 3. Hold your left / right ankle with your other hand. 4. Keeping your knee steady, gently pull your left / right ankle toward your other shoulder until you feel a stretch in your buttocks. ? Keep your hips and shoulders firmly planted while you do this stretch. 5. Hold this position for __________ seconds. Repeat __________ times. Complete this exercise __________ times a day. Seated stretch This exercise is sometimes called hamstrings and adductors stretch. 1. Sit on the floor with your legs stretched wide. Keep your knees straight during this exercise. 2. Keeping your head and back in a straight line, bend at your waist to reach for your left foot (position A). You should feel a stretch in your right inner thigh (adductors). 3. Hold this position for __________ seconds. Then slowly return to the upright position. 4. Keeping your head and back in a straight line, bend at your waist to reach forward (position B). You should feel a stretch behind both of your thighs and knees (hamstrings). 5. Hold this position for __________ seconds. Then slowly return to the upright position. 6. Keeping your head and back in a straight line, bend at your waist to reach for your right foot (position C). You should feel a stretch in your left inner thigh (adductors). 7. Hold this position for __________ seconds. Then slowly return to the upright position. Repeat __________ times. Complete this exercise __________ times a day. Lunge This exercise stretches the muscles of the hip (hip  flexors). 1. Place your left / right knee on the floor and bend your other knee so that is directly over your ankle. You should be half-kneeling. 2. Keep good posture with your head over your shoulders. 3. Tighten your buttocks to point your tailbone downward. This will prevent your back from arching too much. 4. You should feel a gentle stretch in the front of your left / right thigh and hip. If you do not feel a stretch, slide your other foot forward slightly and then slowly lunge forward with your chest up until your knee once again lines up over your ankle. ? Make sure your tailbone continues to point downward. 5. Hold this position for __________ seconds. 6. Slowly return to the starting position. Repeat __________ times. Complete this exercise __________ times a day. Strengthening exercises   These exercises build strength and endurance in your hip. Endurance is the ability to use your muscles for a long time, even after they get tired. Bridge This exercise strengthens the muscles of your hip (hip extensors). 1. Lie on your back on a firm surface with your knees bent and your feet flat on the floor. 2. Tighten your buttocks muscles and lift your bottom off the floor until the trunk of your body and your hips are level with your thighs. ? Do not arch your back. ? You should feel the muscles working in your buttocks and the back of your thighs. If you do not feel these muscles, slide your feet 1-2 inches (2.5-5 cm) farther away from your buttocks. 3. Hold this position for __________ seconds. 4. Slowly lower your hips to the starting position. 5. Let your muscles relax completely between repetitions. Repeat __________ times. Complete this exercise __________ times a day. Straight leg raises, side-lying This exercise strengthens the muscles that move the hip joint away from the center of the body (hip abductors). 1. Lie on your side with your left / right leg in the top position. Lie so your  head, shoulder, hip, and knee line up. You may bend your bottom knee slightly to help you balance. 2. Roll your hips slightly forward, so your hips are stacked directly over each other and your left / right knee is facing forward. 3. Leading with your heel, lift your top leg 4-6 inches (10-15 cm). You should feel the muscles in your top hip lifting. ? Do not let your foot drift forward. ? Do not let your knee roll toward the ceiling. 4. Hold this position for __________ seconds. 5. Slowly return to the starting position. 6. Let your muscles relax completely between repetitions. Repeat __________ times. Complete this exercise __________ times a day. Straight leg raises, side-lying This exercise strengthens the muscles that move the hip joint toward the center of the body (hip adductors). 1. Lie on your side with your left / right leg in the bottom position. Lie so your head, shoulder, hip, and knee line up. You may place your upper foot in front to help you balance. 2. Roll your hips slightly forward, so your hips are stacked directly over each other and your left / right knee is facing forward. 3. Tense the muscles in your inner thigh and lift your bottom leg 4-6 inches (10-15 cm). 4. Hold this position for __________ seconds. 5. Slowly return to the starting position. 6. Let your muscles relax completely between repetitions. Repeat __________ times. Complete this exercise __________ times a day. Straight leg raises, supine This exercise strengthens the muscles in the front of your thigh (quadriceps). 1. Lie on your back (supine position) with your left / right leg extended and your other knee bent. 2. Tense the muscles in the front of your left / right thigh. You should see your kneecap slide up or see increased dimpling just above your knee. 3. Keep these muscles tight as you raise your leg 4-6 inches (10-15 cm) off the floor. Do not let your knee bend. 4. Hold this position for __________  seconds. 5. Keep these muscles tense as you lower your leg. 6. Relax the muscles slowly and completely between repetitions. Repeat __________ times. Complete this exercise __________ times a day. Hip abductors, standing This exercise strengthens the muscles that move the leg and hip joint away from the center of the body (hip abductors). 1. Tie one end of a   rubber exercise band or tubing to a secure surface, such as a chair, table, or pole. 2. Loop the other end of the band or tubing around your left / right ankle. 3. Keeping your ankle with the band or tubing directly opposite the secured end, step away until there is tension in the tubing or band. Hold on to a chair, table, or pole as needed for balance. 4. Lift your left / right leg out to your side. While you do this: ? Keep your back upright. ? Keep your shoulders over your hips. ? Keep your toes pointing forward. ? Make sure to use your hip muscles to slowly lift your leg. Do not tip your body or forcefully lift your leg. 5. Hold this position for __________ seconds. 6. Slowly return to the starting position. Repeat __________ times. Complete this exercise __________ times a day. Squats This exercise strengthens the muscles in the front of your thigh (quadriceps). 1. Stand in a door frame so your feet and knees are in line with the frame. You may place your hands on the frame for balance. 2. Slowly bend your knees and lower your hips like you are going to sit in a chair. ? Keep your lower legs in a straight-up-and-down position. ? Do not let your hips go lower than your knees. ? Do not bend your knees lower than told by your health care provider. ? If your hip pain increases, do not bend as low. 3. Hold this position for ___________ seconds. 4. Slowly push with your legs to return to standing. Do not use your hands to pull yourself to standing. Repeat __________ times. Complete this exercise __________ times a day. This information  is not intended to replace advice given to you by your health care provider. Make sure you discuss any questions you have with your health care provider. Document Revised: 01/06/2019 Document Reviewed: 04/13/2018 Elsevier Patient Education  2020 Elsevier Inc.  

## 2020-04-24 ENCOUNTER — Telehealth: Payer: Self-pay | Admitting: Physician Assistant

## 2020-04-24 NOTE — Telephone Encounter (Signed)
Copied from CRM [id number]. Topic: Medicare AWV >> Apr 24, 2020 10:18 AM Claudette Laws R wrote: Reason for CRM:  Left message for patient to call back and schedule Medicare Annual Wellness Visit (AWV) either virtually or in office.  No hx of AWV - AWV-I eligible as of 07/17/2017  Please schedule at anytime with [facility] Health Advisor.   40 minute appointment  Any questions, please contact me at [phone]

## 2020-07-13 ENCOUNTER — Telehealth: Payer: Self-pay | Admitting: Physician Assistant

## 2020-07-13 NOTE — Telephone Encounter (Signed)
Patient declined the Medicare Wellness Visit with [facility]. Does not feel it is necessary for him to have completed
